# Patient Record
Sex: Female | Born: 1955
Health system: Southern US, Community
[De-identification: ages and names within clinical notes are randomized; demographics above are authoritative.]

---

## 2018-04-26 ENCOUNTER — Encounter (HOSPITAL_BASED_OUTPATIENT_CLINIC_OR_DEPARTMENT_OTHER): Payer: Self-pay | Admitting: *Deleted

## 2018-04-26 ENCOUNTER — Emergency Department (HOSPITAL_BASED_OUTPATIENT_CLINIC_OR_DEPARTMENT_OTHER): Payer: Self-pay

## 2018-04-26 ENCOUNTER — Emergency Department (HOSPITAL_BASED_OUTPATIENT_CLINIC_OR_DEPARTMENT_OTHER)
Admission: EM | Admit: 2018-04-26 | Discharge: 2018-04-26 | Disposition: A | Discharge status: Home / Self Care | Payer: Self-pay | Attending: Emergency Medicine | Admitting: Emergency Medicine

## 2018-04-26 ENCOUNTER — Other Ambulatory Visit: Payer: Self-pay

## 2018-04-26 DIAGNOSIS — R1011 Right upper quadrant pain: Secondary | ICD-10-CM | POA: Insufficient documentation

## 2018-04-26 DIAGNOSIS — R112 Nausea with vomiting, unspecified: Secondary | ICD-10-CM | POA: Insufficient documentation

## 2018-04-26 DIAGNOSIS — F17210 Nicotine dependence, cigarettes, uncomplicated: Secondary | ICD-10-CM | POA: Insufficient documentation

## 2018-04-26 LAB — COMPREHENSIVE METABOLIC PANEL
ALT: 18 U/L (ref 0–44)
AST: 31 U/L (ref 15–41)
Albumin: 4.2 g/dL (ref 3.5–5.0)
Alkaline Phosphatase: 58 U/L (ref 38–126)
Anion gap: 7 (ref 5–15)
BUN: 14 mg/dL (ref 8–23)
CO2: 24 mmol/L (ref 22–32)
Calcium: 8.7 mg/dL — ABNORMAL LOW (ref 8.9–10.3)
Chloride: 107 mmol/L (ref 98–111)
Creatinine, Ser: 0.5 mg/dL (ref 0.44–1.00)
Glucose, Bld: 110 mg/dL — ABNORMAL HIGH (ref 70–99)
Potassium: 3.4 mmol/L — ABNORMAL LOW (ref 3.5–5.1)
Sodium: 138 mmol/L (ref 135–145)
Total Bilirubin: 0.3 mg/dL (ref 0.3–1.2)
Total Protein: 7.1 g/dL (ref 6.5–8.1)

## 2018-04-26 LAB — CBC WITH DIFFERENTIAL/PLATELET
Abs Immature Granulocytes: 0.04 10*3/uL (ref 0.00–0.07)
Basophils Absolute: 0.1 10*3/uL (ref 0.0–0.1)
Basophils Relative: 1 %
Eosinophils Absolute: 0.1 10*3/uL (ref 0.0–0.5)
Eosinophils Relative: 1 %
HCT: 42.9 % (ref 36.0–46.0)
Hemoglobin: 13.5 g/dL (ref 12.0–15.0)
Immature Granulocytes: 0 %
Lymphocytes Relative: 10 %
Lymphs Abs: 1.2 10*3/uL (ref 0.7–4.0)
MCH: 31 pg (ref 26.0–34.0)
MCHC: 31.5 g/dL (ref 30.0–36.0)
MCV: 98.4 fL (ref 80.0–100.0)
Monocytes Absolute: 0.4 10*3/uL (ref 0.1–1.0)
Monocytes Relative: 4 %
Neutro Abs: 9.8 10*3/uL — ABNORMAL HIGH (ref 1.7–7.7)
Neutrophils Relative %: 84 %
Platelets: 400 10*3/uL (ref 150–400)
RBC: 4.36 MIL/uL (ref 3.87–5.11)
RDW: 13.4 % (ref 11.5–15.5)
WBC: 11.6 10*3/uL — AB (ref 4.0–10.5)
nRBC: 0 % (ref 0.0–0.2)

## 2018-04-26 LAB — URINALYSIS, ROUTINE W REFLEX MICROSCOPIC
BILIRUBIN URINE: NEGATIVE
Glucose, UA: NEGATIVE mg/dL
Hgb urine dipstick: NEGATIVE
Ketones, ur: NEGATIVE mg/dL
Leukocytes, UA: NEGATIVE
Nitrite: NEGATIVE
PH: 5 (ref 5.0–8.0)
Protein, ur: NEGATIVE mg/dL
Specific Gravity, Urine: >1.03 — ABNORMAL HIGH (ref 1.005–1.030)

## 2018-04-26 LAB — LIPASE, BLOOD: LIPASE: 25 U/L (ref 11–51)

## 2018-04-26 MED ORDER — HYDROCODONE-ACETAMINOPHEN 5-325 MG PO TABS: 1.0000 | ORAL_TABLET | Freq: Four times a day (QID) | ORAL | 0 refills | Status: AC | PRN

## 2018-04-26 MED ORDER — ONDANSETRON 4 MG PO TBDP: 4.0000 mg | ORAL_TABLET | Freq: Three times a day (TID) | ORAL | 0 refills | Status: AC | PRN

## 2018-04-26 MED ORDER — ONDANSETRON HCL 4 MG/2ML IJ SOLN
4.0000 mg | Freq: Once | INTRAMUSCULAR | Status: AC
  Administered 2018-04-26: 4 mg via INTRAVENOUS
  Filled 2018-04-26: qty 2

## 2018-04-26 MED ORDER — SODIUM CHLORIDE 0.9 % IV BOLUS
500.0000 mL | Freq: Once | INTRAVENOUS | Status: AC
  Administered 2018-04-26: 500 mL via INTRAVENOUS

## 2018-04-26 MED FILL — ONDANSETRON ODT 4 MG TABLET: 4 | 6 days supply | Qty: 20 | Fill #0

## 2018-04-26 MED FILL — HYDROCODON-APAP 5-325: 5-325 | 2 days supply | Qty: 10 | Fill #0

## 2018-04-26 NOTE — Discharge Instructions (Signed)
You have been seen in the Emergency Department (ED) for abdominal pain.  Your evaluation suggests that your pain is caused by gallstones.  Fortunately you do not need immediate surgery at this time, but it is important that you follow up with a surgeon as an outpatient; typically surgical removal of the gallbladder is the only thing that will definitively fix your issue.  Read through the included information about a bland diet, and use any prescribed medications as instructed.  Avoid smoking and alcohol use.  Please follow up as instructed above regarding todays emergent visit and the symptoms that are bothering you.  Take Vicodin as prescribed. Do not drink alcohol, drive or participate in any other potentially dangerous activities while taking this medication as it may make you sleepy. Do not take this medication with any other sedating medications, either prescription or over-the-counter. If you were prescribed Percocet or Vicodin, do not take these with acetaminophen (Tylenol) as it is already contained within these medications.   This medication is an opiate (or narcotic) pain medication and can be habit forming.  Use it as little as possible to achieve adequate pain control.  Do not use or use it with extreme caution if you have a history of opiate abuse or dependence.  This medication is intended for your use only - do not give any to anyone else and keep it in a secure place where nobody else, especially children, have access to it.  It will also cause or worsen constipation, so you may want to consider taking an over-the-counter stool softener while you are taking this medication.  Return to the ED if your abdominal pain worsens or fails to improve, you develop bloody vomiting, bloody diarrhea, you are unable to tolerate fluids due to vomiting, fever greater than 101, or other symptoms that concern you.

## 2018-04-26 NOTE — ED Triage Notes (Signed)
Pt has had stomach and back pain since last night has had 1 episode of vomiting.

## 2018-04-26 NOTE — ED Provider Notes (Signed)
Emergency Department Provider Note   I have reviewed the triage vital signs and the nursing notes.   HISTORY  Chief Complaint Abdominal Pain and Back Pain   HPI Regina JarvisWanda Carter is a 62 y.o. female presents to the emergency department for evaluation of acute onset epigastric abdominal pain radiating to the back with nausea and vomiting. Symptoms began last night.  For dinner the patient ate a hamburger but did not experience any symptoms immediately after eating a burger.  Through the night she had severe epigastric pain and one episode of nonbloody emesis.  No diarrhea.  Patient does have a family member sick with flu but she is not experiencing any respiratory symptoms, fever, body aches.  No similar pain in the past.  No clear inciting event.  History reviewed. No pertinent past medical history.  There are no active problems to display for this patient.   History reviewed. No pertinent surgical history.    Allergies Patient has no known allergies.  History reviewed. No pertinent family history.  Social History Social History   Tobacco Use  . Smoking status: Current Every Day Smoker    Packs/day: 1.00    Types: Cigarettes  . Smokeless tobacco: Never Used  Substance Use Topics  . Alcohol use: Never    Frequency: Never  . Drug use: Never    Review of Systems  Constitutional: No fever/chills Eyes: No visual changes. ENT: No sore throat. Cardiovascular: Denies chest pain. Respiratory: Denies shortness of breath. Gastrointestinal: Positive epigastric abdominal pain. Positive nausea and vomiting.  No diarrhea.  No constipation. Genitourinary: Negative for dysuria. Musculoskeletal: Negative for back pain. Skin: Negative for rash. Neurological: Negative for headaches, focal weakness or numbness.  10-point ROS otherwise negative.  ____________________________________________   PHYSICAL EXAM:  VITAL SIGNS: ED Triage Vitals  Enc Vitals Group     BP 04/26/18  0748 (!) 148/73     Pulse Rate 04/26/18 0748 88     Resp 04/26/18 0748 18     Temp 04/26/18 0748 97.8 F (36.6 C)     Temp Source 04/26/18 0748 Oral     SpO2 04/26/18 0748 98 %     Weight 04/26/18 0749 140 lb (63.5 kg)     Height 04/26/18 0749 5\' 5"  (1.651 m)     Pain Score 04/26/18 0748 8   Constitutional: Alert and oriented. Well appearing and in no acute distress. Eyes: Conjunctivae are normal.  Head: Atraumatic. Nose: No congestion/rhinnorhea. Mouth/Throat: Mucous membranes are moist. Neck: No stridor. Cardiovascular: Normal rate, regular rhythm. Good peripheral circulation. Grossly normal heart sounds.   Respiratory: Normal respiratory effort.  No retractions. Lungs CTAB. Gastrointestinal: Soft with mid-epigastric tenderness and mild RUQ tenderness. Negative Murphy's sign. No distention.  Musculoskeletal: No lower extremity tenderness nor edema. No gross deformities of extremities. Neurologic:  Normal speech and language. No gross focal neurologic deficits are appreciated.  Skin:  Skin is warm, dry and intact. No rash noted.  ____________________________________________   LABS (all labs ordered are listed, but only abnormal results are displayed)  Labs Reviewed  COMPREHENSIVE METABOLIC PANEL - Abnormal; Notable for the following components:      Result Value   Potassium 3.4 (*)    Glucose, Bld 110 (*)    Calcium 8.7 (*)    All other components within normal limits  CBC WITH DIFFERENTIAL/PLATELET - Abnormal; Notable for the following components:   WBC 11.6 (*)    Neutro Abs 9.8 (*)    All other components within  normal limits  URINALYSIS, ROUTINE W REFLEX MICROSCOPIC - Abnormal; Notable for the following components:   Specific Gravity, Urine >1.030 (*)    All other components within normal limits  LIPASE, BLOOD   ____________________________________________  RADIOLOGY  [place] Abdomen Limited Ruq  Result Date: 04/26/2018 CLINICAL DATA:  [id number] year old female with right  upper quadrant pain, nausea and vomiting since last night. EXAM: ULTRASOUND ABDOMEN LIMITED RIGHT UPPER QUADRANT COMPARISON:  None. FINDINGS: Gallbladder: Shadowing echogenic gallstones (image 19) individually estimated at 10 millimeters diameter (image 5). Gallbladder wall thickness at the upper limits of normal, 3 millimeters. No sonographic Murphy sign elicited. No pericholecystic fluid. Common bile duct: Diameter: 3 millimeters, normal. Liver: No focal lesion identified. Within normal limits in parenchymal echogenicity. Portal vein is patent on color Doppler imaging with normal direction of blood flow towards the liver. Other findings: Negative visible right kidney. IMPRESSION: 1. Positive for gallstones with gallbladder wall thickness at the upper limits of normal, but no sonographic Murphy sign to strongly suggest acute cholecystitis. 2. No evidence of bile duct obstruction. Electronically Signed   By: Odessa FlemingH  Hall M.D.   On: 04/26/2018 08:50    ____________________________________________   PROCEDURES  Procedure(s) performed:   Procedures  None ____________________________________________   INITIAL IMPRESSION / ASSESSMENT AND PLAN / ED COURSE  Pertinent labs & imaging results that were available during my care of the patient were reviewed by me and considered in my medical decision making (see chart for details).  Patient with acute onset of midepigastric pain along with nausea and vomiting.  She has mild epigastric and right upper quadrant tenderness.  No clear Murphy sign.  Plan for right upper quadrant ultrasound, labs, nausea medication.   US with no acute cholecystitis. I do suspect biliary colic clinically. No symptoms to suspect atypical ACS. Labs including LFTs and Bili are normal. Pain and nausea much improved in the ED. Plan for outpatient surgery follow up. Discussed ED return precautions.  ____________________________________________  FINAL CLINICAL IMPRESSION(S) / ED  DIAGNOSES  Final diagnoses:  RUQ abdominal pain  Non-intractable vomiting with nausea, unspecified vomiting type     MEDICATIONS GIVEN DURING THIS VISIT:  Medications  sodium chloride 0.9 % bolus 500 mL (0 mLs Intravenous Stopped 04/26/18 0945)  ondansetron (ZOFRAN) injection 4 mg (4 mg Intravenous Given 04/26/18 0816)     NEW OUTPATIENT MEDICATIONS STARTED DURING THIS VISIT:  Discharge Medication List as of 04/26/2018  9:22 AM    START taking these medications   Details  HYDROcodone-acetaminophen (NORCO/VICODIN) 5-325 MG tablet Take 1 tablet by mouth every 6 (six) hours as needed for severe pain., Starting Mon 04/26/2018, Print    ondansetron (ZOFRAN ODT) 4 MG disintegrating tablet Take 1 tablet (4 mg total) by mouth every 8 (eight) hours as needed for nausea., Starting Mon 04/26/2018, Print        Note:  This document was prepared using Dragon voice recognition software and may include unintentional dictation errors.  Alona BeneJoshua Sasan Wilkie, MD Emergency Medicine    Jameya Pontiff, Arlyss RepressJoshua G, MD 04/26/18 [phone]

## 2019-11-02 DIAGNOSIS — L02811 Cutaneous abscess of head [any part, except face]: Secondary | ICD-10-CM | POA: Diagnosis not present

## 2019-11-02 DIAGNOSIS — L0211 Cutaneous abscess of neck: Secondary | ICD-10-CM | POA: Diagnosis not present

## 2019-12-26 DIAGNOSIS — Z20822 Contact with and (suspected) exposure to covid-19: Secondary | ICD-10-CM | POA: Diagnosis not present

## 2020-07-27 IMAGING — US US ABDOMEN LIMITED
1 series · 14 of 25 positions shown · non-contrast
Comparison: None.

CLINICAL DATA: 62-year-old female with right upper quadrant pain,
nausea and vomiting since last night.

EXAM:
ULTRASOUND ABDOMEN LIMITED RIGHT UPPER QUADRANT

[Series 1: us abdomen limited · 0.17mm/px · 14 of 43 slices shown]
[im 1/43]
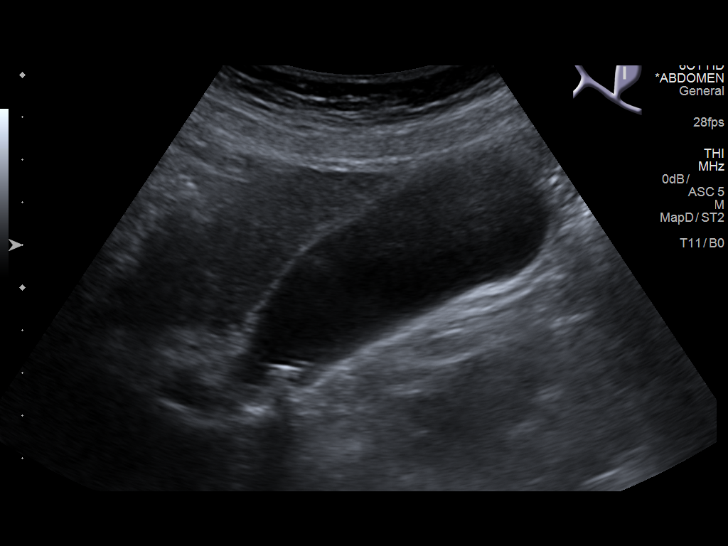
[im 4/43]
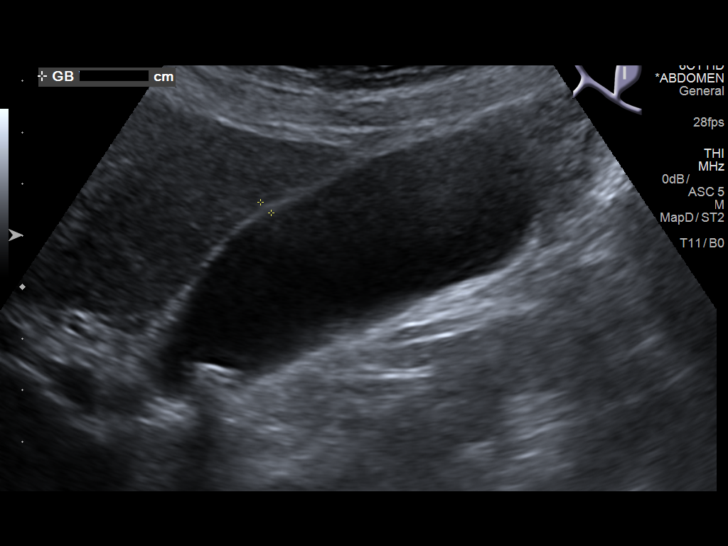
[im 8/43]
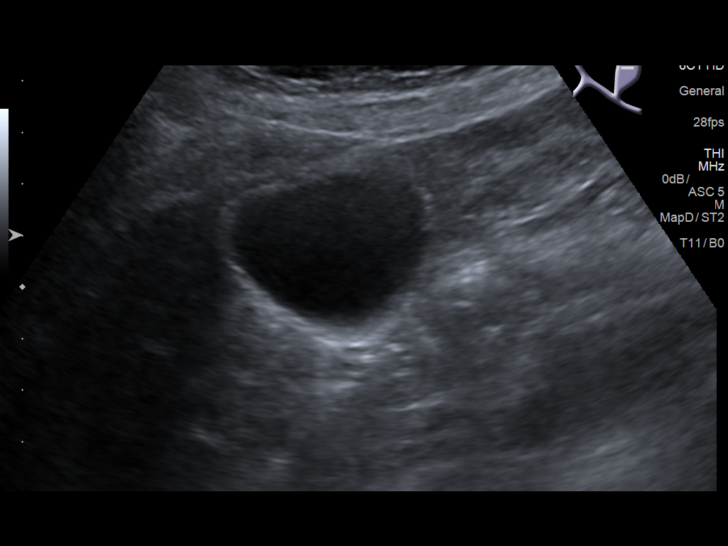
[im 11/43]
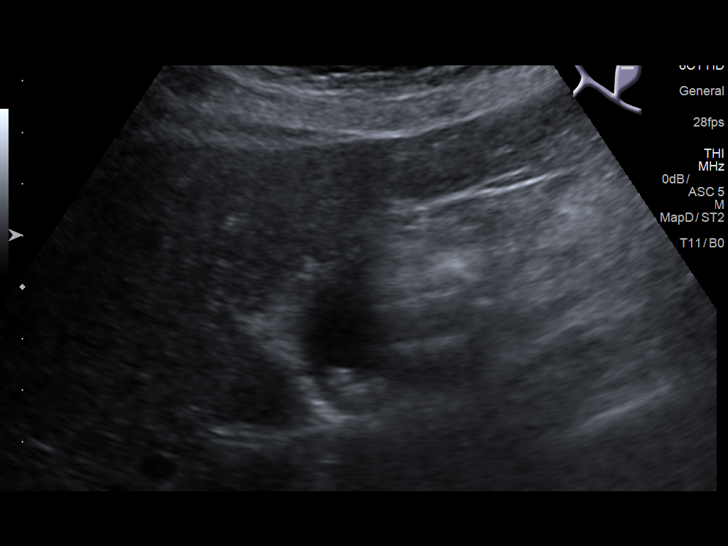
[im 15/43]
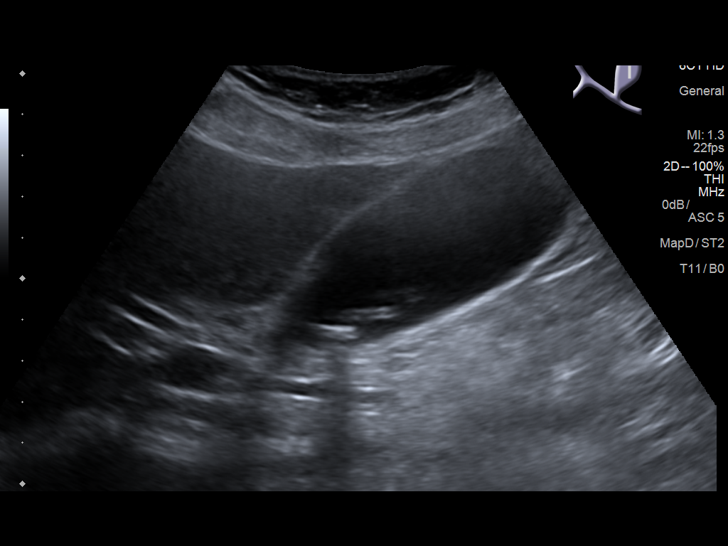
[im 16/43]
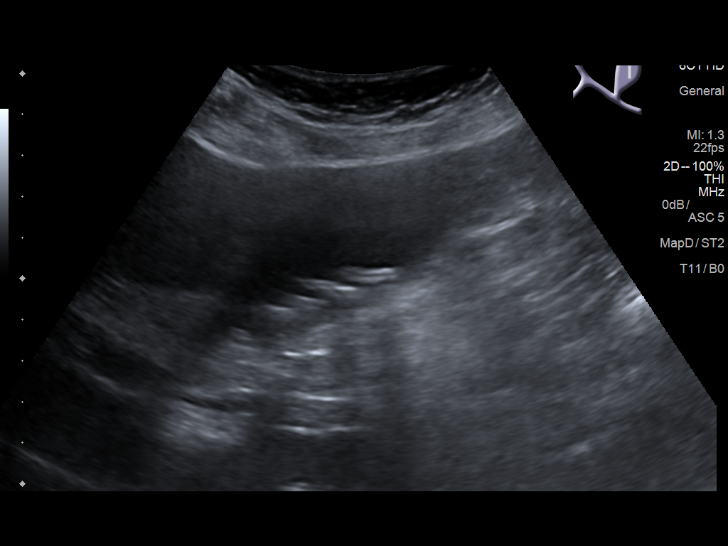
[im 20/43]
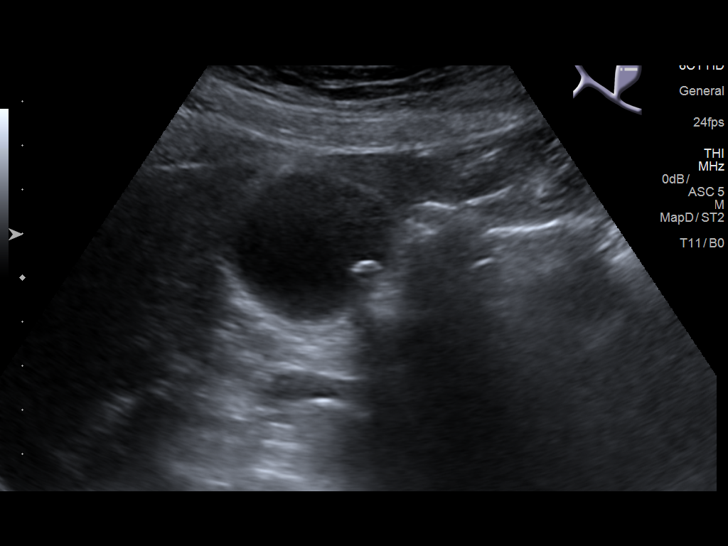
[im 23/43]
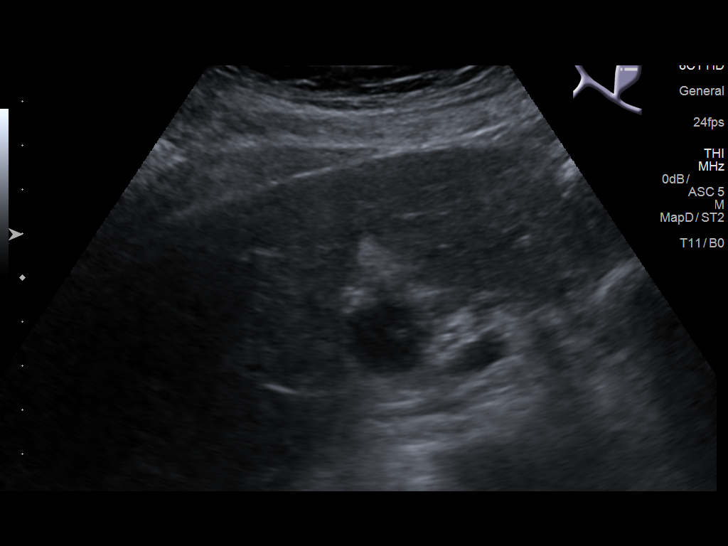
[im 27/43]
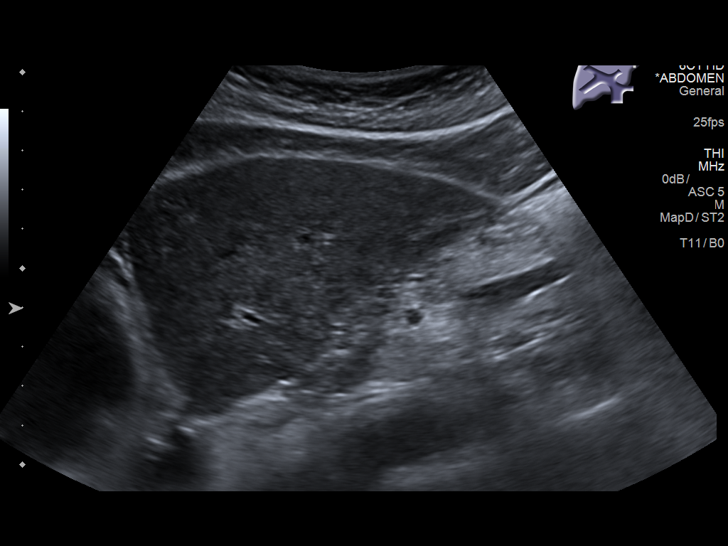
[im 29/43]
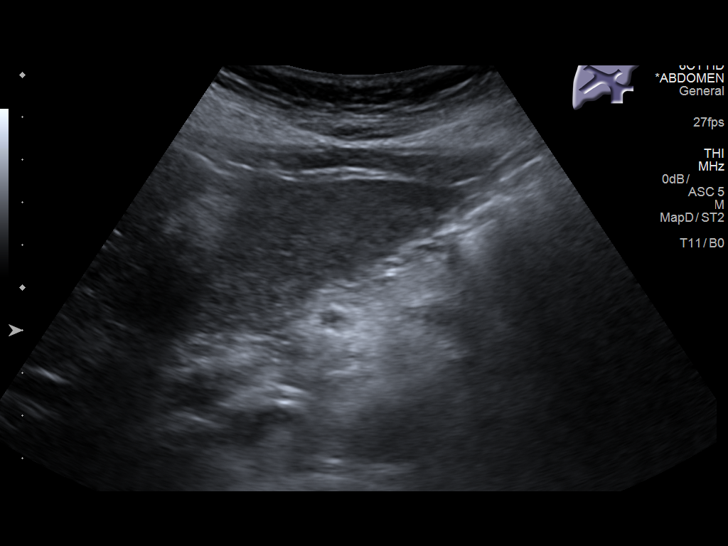
[im 32/43]
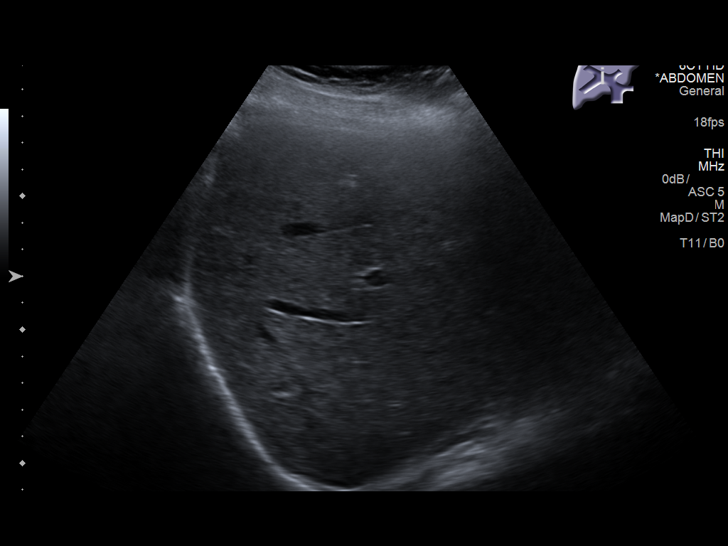
[im 36/43]
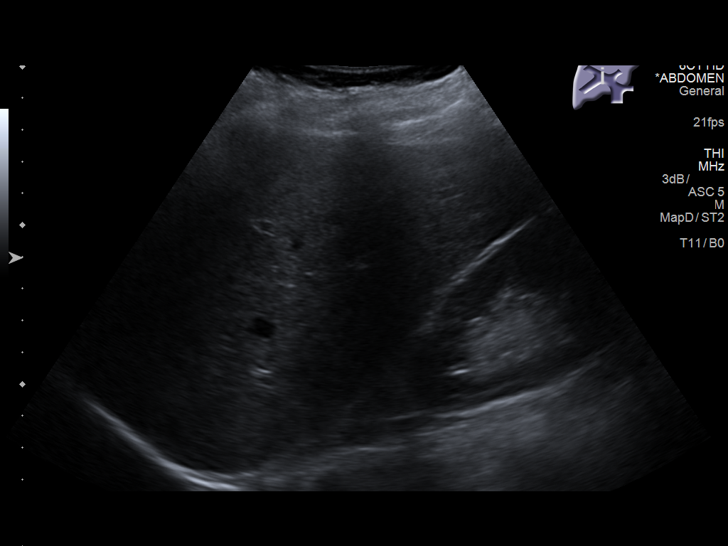
[im 39/43]
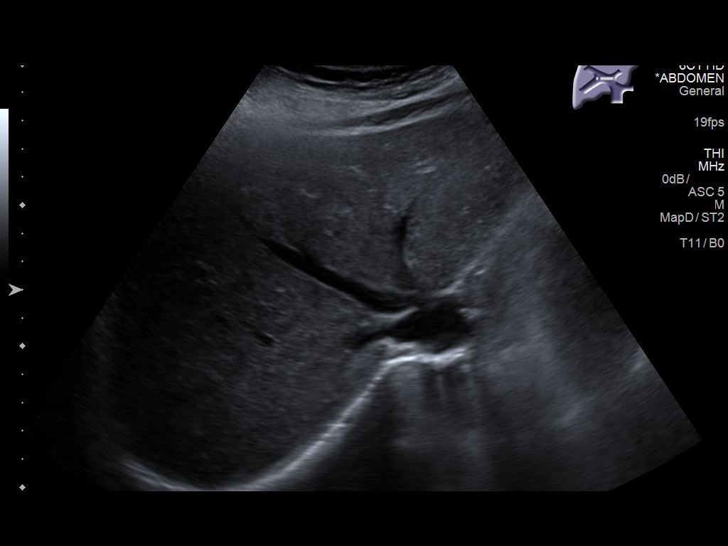
[im 43/43]
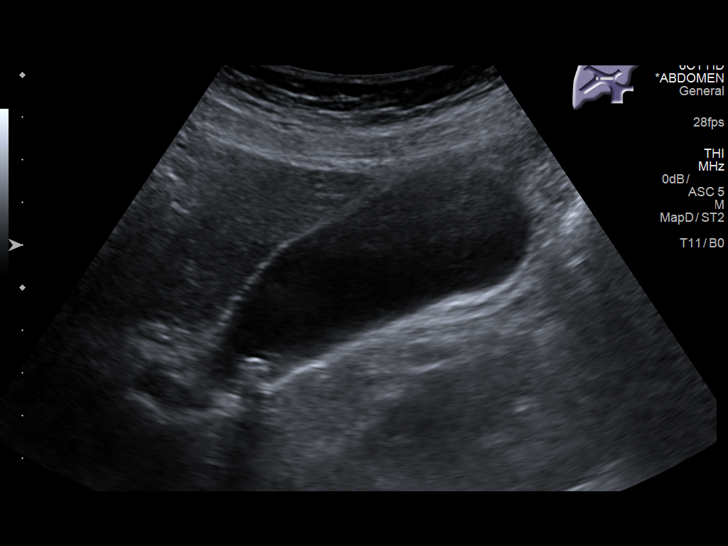

[14 of 25 positions shown; findings below may reference images not displayed]

FINDINGS: Gallbladder:

Shadowing echogenic gallstones (image 19) individually estimated at
10 millimeters diameter (image 5). Gallbladder wall thickness at the
upper limits of normal, 3 millimeters. No sonographic Murphy sign
elicited. No pericholecystic fluid.

Common bile duct:

Diameter: 3 millimeters, normal.

Liver:

No focal lesion identified. Within normal limits in parenchymal
echogenicity. Portal vein is patent on color Doppler imaging with
normal direction of blood flow towards the liver.

Other findings: Negative visible right kidney.
IMPRESSION: 1. Positive for gallstones with gallbladder wall thickness at the
upper limits of normal, but no sonographic Murphy sign to strongly
suggest acute cholecystitis.
2. No evidence of bile duct obstruction.
